# Patient Record
Sex: Male | Born: 2005
Health system: Southern US, Community
[De-identification: ages and names within clinical notes are randomized; demographics above are authoritative.]

## PROBLEM LIST (undated history)

## (undated) DIAGNOSIS — J121 Respiratory syncytial virus pneumonia: Secondary | ICD-10-CM

## (undated) HISTORY — DX: Respiratory syncytial virus pneumonia: J12.1

## (undated) HISTORY — PX: LIVER BIOPSY: SHX301

## (undated) HISTORY — PX: LEG SURGERY: SHX1003

## (undated) HISTORY — PX: CIRCUMCISION: SHX1350

---
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
## 2024-01-05 ENCOUNTER — Emergency Department (HOSPITAL_COMMUNITY)

## 2024-01-05 ENCOUNTER — Other Ambulatory Visit: Payer: Self-pay

## 2024-01-05 ENCOUNTER — Emergency Department (HOSPITAL_COMMUNITY): Admission: EM | Admit: 2024-01-05 | Discharge: 2024-01-05 | Disposition: A | Discharge status: Home / Self Care

## 2024-01-05 ENCOUNTER — Encounter (HOSPITAL_COMMUNITY): Payer: Self-pay

## 2024-01-05 DIAGNOSIS — R0789 Other chest pain: Secondary | ICD-10-CM

## 2024-01-05 DIAGNOSIS — R079 Chest pain, unspecified: Secondary | ICD-10-CM | POA: Insufficient documentation

## 2024-01-05 LAB — CBC WITH DIFFERENTIAL/PLATELET
Abs Immature Granulocytes: 0.04 K/uL (ref 0.00–0.07)
Basophils Absolute: 0.1 K/uL (ref 0.0–0.1)
Basophils Relative: 1 %
Eosinophils Absolute: 0.2 K/uL (ref 0.0–0.5)
Eosinophils Relative: 2 %
HCT: 47.9 % (ref 39.0–52.0)
Hemoglobin: 16.4 g/dL (ref 13.0–17.0)
Immature Granulocytes: 0 %
Lymphocytes Relative: 20 %
Lymphs Abs: 2 K/uL (ref 0.7–4.0)
MCH: 29.9 pg (ref 26.0–34.0)
MCHC: 34.2 g/dL (ref 30.0–36.0)
MCV: 87.2 fL (ref 80.0–100.0)
Monocytes Absolute: 0.7 K/uL (ref 0.1–1.0)
Monocytes Relative: 7 %
Neutro Abs: 7.3 K/uL (ref 1.7–7.7)
Neutrophils Relative %: 70 %
Platelets: 299 K/uL (ref 150–400)
RBC: 5.49 MIL/uL (ref 4.22–5.81)
RDW: 12.9 % (ref 11.5–15.5)
WBC: 10.3 K/uL (ref 4.0–10.5)
nRBC: 0 % (ref 0.0–0.2)

## 2024-01-05 LAB — BASIC METABOLIC PANEL WITH GFR
Anion gap: 10 (ref 5–15)
BUN: 8 mg/dL (ref 6–20)
CO2: 24 mmol/L (ref 22–32)
Calcium: 9.6 mg/dL (ref 8.9–10.3)
Chloride: 106 mmol/L (ref 98–111)
Creatinine, Ser: 0.71 mg/dL (ref 0.61–1.24)
GFR, Estimated: >60 mL/min (ref >60)
Glucose, Bld: 103 mg/dL — ABNORMAL HIGH (ref 70–99)
Potassium: 4.1 mmol/L (ref 3.5–5.1)
Sodium: 140 mmol/L (ref 135–145)

## 2024-01-05 LAB — TROPONIN I (HIGH SENSITIVITY): Troponin I (High Sensitivity): <2 ng/L (ref <18)

## 2024-01-05 MED ORDER — KETOROLAC TROMETHAMINE 15 MG/ML IJ SOLN
15.0000 mg | Freq: Once | INTRAMUSCULAR | Status: AC
  Administered 2024-01-05: 15 mg via INTRAMUSCULAR
  Filled 2024-01-05: qty 1

## 2024-01-05 NOTE — ED Provider Notes (Signed)
 [place] EMERGENCY DEPARTMENT AT [facility] Provider Note   CSN: [id number] Arrival date & time: 01/05/24  1742     Patient presents with: Chest Pain   Marvin Lopez is a 18 y.o. male.   18 year old male presents for evaluation of chest pain.  States that started yesterday.  States has been off-and-on and sharp in nature located in the center of his chest.  He has not been short of breath or had any nausea or vomiting or lightheadedness.  He denies any other symptoms or concerns.   Chest Pain Associated symptoms: no abdominal pain, no back pain, no cough, no fever, no palpitations, no shortness of breath and no vomiting        Prior to Admission medications   Medication Sig Start Date End Date Taking? Authorizing Provider  albuterol  (VENTOLIN  HFA) 108 (90 Base) MCG/ACT inhaler Inhale 2 puffs into the lungs every 6 (six) hours as needed for wheezing or shortness of breath.    [provider]  Albuterol -Budesonide (AIRSUPRA ) 90-80 MCG/ACT AERO Inhale 2 Inhalations into the lungs as needed (for wheezing or shortness of breath). Up to 12 inhalations a day 04/07/23   Joesph Annabella HERO, FNP    Allergies: Banana, Cashew nut (anacardium occidentale) skin test, Cashew nut oil, and Amoxicillin    Review of Systems  Constitutional:  Negative for chills and fever.  HENT:  Negative for ear pain and sore throat.   Eyes:  Negative for pain and visual disturbance.  Respiratory:  Negative for cough and shortness of breath.   Cardiovascular:  Positive for chest pain. Negative for palpitations.  Gastrointestinal:  Negative for abdominal pain and vomiting.  Genitourinary:  Negative for dysuria and hematuria.  Musculoskeletal:  Negative for arthralgias and back pain.  Skin:  Negative for color change and rash.  Neurological:  Negative for seizures and syncope.  All other systems reviewed and are negative.   Updated Vital Signs BP 128/71 (BP Location: Right Arm)    Pulse 72   Temp 98 F (36.7 C) (Oral)   Resp 18   Ht 5' 9 (1.753 m)   Wt 117.9 kg   SpO2 100%   BMI 38.40 kg/m   Physical Exam Vitals and nursing note reviewed.  Constitutional:      General: He is not in acute distress.    Appearance: He is well-developed. He is not ill-appearing, toxic-appearing or diaphoretic.  HENT:     Head: Normocephalic and atraumatic.  Eyes:     Conjunctiva/sclera: Conjunctivae normal.  Cardiovascular:     Rate and Rhythm: Normal rate and regular rhythm.     Heart sounds: No murmur heard. Pulmonary:     Effort: Pulmonary effort is normal. No respiratory distress.     Breath sounds: Normal breath sounds.  Abdominal:     Palpations: Abdomen is soft.     Tenderness: There is no abdominal tenderness.  Musculoskeletal:        General: No swelling.     Cervical back: Neck supple.  Skin:    General: Skin is warm and dry.     Capillary Refill: Capillary refill takes less than 2 seconds.  Neurological:     Mental Status: He is alert.  Psychiatric:        Mood and Affect: Mood normal.     (all labs ordered are listed, but only abnormal results are displayed) Labs Reviewed  BASIC METABOLIC PANEL WITH GFR - Abnormal; Notable for the following components:  Result Value   Glucose, Bld 103 (*)    All other components within normal limits  CBC WITH DIFFERENTIAL/PLATELET  TROPONIN I (HIGH SENSITIVITY)  TROPONIN I (HIGH SENSITIVITY)    EKG: EKG Interpretation Date/Time:  Wednesday January 05 2024 17:58:11 EDT Ventricular Rate:  93 PR Interval:  146 QRS Duration:  96 QT Interval:  364 QTC Calculation: 452 R Axis:   28  Text Interpretation: Sinus rhythm with marked sinus arrhythmia Nonspecific ST changes in the inferior leads Confirmed by Gennaro Bouchard ([id number]) on 01/05/2024 6:04:04 PM  Radiology: [facility] Chest 2 View Result Date: 01/05/2024 CLINICAL DATA:  Chest pain. EXAM: CHEST - 2 VIEW COMPARISON:  04/07/2023 FINDINGS: The  cardiomediastinal contours are normal. The lungs are clear. Pulmonary vasculature is normal. No consolidation, pleural effusion, or pneumothorax. No acute osseous abnormalities are seen. IMPRESSION: No active cardiopulmonary disease. Electronically Signed   By: Andrea Gasman M.D.   On: 01/05/2024 18:43     Procedures   Medications Ordered in the ED  ketorolac  (TORADOL ) 15 MG/ML injection 15 mg (15 mg Intramuscular Given 01/05/24 2050)                                    Medical Decision Making Cardiac monitor interpretation: Sinus rhythm, no ectopy  Patient here for what sounds like musculoskeletal chest pain.  He is chest pain-free at this time.  Lab workup unremarkable, troponin negative chest x-ray and EKG within normal limits.  Patient be given Toradol  here.  Advised Tylenol Motrin as needed for pain.  Advise follow-up with primary care doctor as needed otherwise return to the ER for any new or worsening symptoms.  Patient feels comfortable to plan be discharged home.  Problems Addressed: Musculoskeletal chest pain: acute illness or injury  Amount and/or Complexity of Data Reviewed External Data Reviewed: notes.    Details: Prior ED records reviewed and patient was seen last year for cough Labs: ordered. Decision-making details documented in ED Course.    Details: Ordered and reviewed by me and unremarkable Radiology: ordered and independent interpretation performed. Decision-making details documented in ED Course.    Details: Ordered and interpreted by me independently radiology Chest x-ray: Shows no acute abnormality in the chest ECG/medicine tests: ordered and independent interpretation performed. Decision-making details documented in ED Course.    Details: Ordered and interpreted by me in the absence of cardiology and shows sinus rhythm, no STEMI or acute change when compared to prior EKG  Risk OTC drugs. Prescription drug management.    Final diagnoses:   Musculoskeletal chest pain    ED Discharge Orders     None          Gennaro Bouchard CROME, DO 01/05/24 2059

## 2024-01-05 NOTE — ED Triage Notes (Signed)
 Reports started having central chest pain yesterday b ut today has not gone away.  Reports worse in certain positions.  Reports unsure if its from smoking or muscular.

## 2024-01-05 NOTE — Discharge Instructions (Addendum)
 You can alternate Tylenol and Motrin as needed for pain.  Follow-up with your primary care doctor as needed.

## 2024-02-17 ENCOUNTER — Ambulatory Visit (INDEPENDENT_AMBULATORY_CARE_PROVIDER_SITE_OTHER): Admitting: Family Medicine

## 2024-02-17 ENCOUNTER — Encounter: Payer: Self-pay | Admitting: Family Medicine

## 2024-02-17 VITALS — BP 132/70 | HR 86 | Temp 98.4°F | Ht 69.0 in | Wt 309.2 lb

## 2024-02-17 DIAGNOSIS — Z23 Encounter for immunization: Secondary | ICD-10-CM | POA: Diagnosis not present

## 2024-02-17 DIAGNOSIS — Z0001 Encounter for general adult medical examination with abnormal findings: Secondary | ICD-10-CM | POA: Diagnosis not present

## 2024-02-17 DIAGNOSIS — J4521 Mild intermittent asthma with (acute) exacerbation: Secondary | ICD-10-CM | POA: Diagnosis not present

## 2024-02-17 DIAGNOSIS — Z0000 Encounter for general adult medical examination without abnormal findings: Secondary | ICD-10-CM

## 2024-02-17 DIAGNOSIS — K760 Fatty (change of) liver, not elsewhere classified: Secondary | ICD-10-CM

## 2024-02-17 NOTE — Patient Instructions (Signed)
 Health Maintenance, Male  Adopting a healthy lifestyle and getting preventive care are important in promoting health and wellness. Ask your health care provider about:  The right schedule for you to have regular tests and exams.  Things you can do on your own to prevent diseases and keep yourself healthy.  What should I know about diet, weight, and exercise?  Eat a healthy diet    Eat a diet that includes plenty of vegetables, fruits, low-fat dairy products, and lean protein.  Do not eat a lot of foods that are high in solid fats, added sugars, or sodium.  Maintain a healthy weight  Body mass index (BMI) is a measurement that can be used to identify possible weight problems. It estimates body fat based on height and weight. Your health care provider can help determine your BMI and help you achieve or maintain a healthy weight.  Get regular exercise  Get regular exercise. This is one of the most important things you can do for your health. Most adults should:  Exercise for at least 150 minutes each week. The exercise should increase your heart rate and make you sweat (moderate-intensity exercise).  Do strengthening exercises at least twice a week. This is in addition to the moderate-intensity exercise.  Spend less time sitting. Even light physical activity can be beneficial.  Watch cholesterol and blood lipids  Have your blood tested for lipids and cholesterol at 19 years of age, then have this test every 5 years.  You may need to have your cholesterol levels checked more often if:  Your lipid or cholesterol levels are high.  You are older than 19 years of age.  You are at high risk for heart disease.  What should I know about cancer screening?  Many types of cancers can be detected early and may often be prevented. Depending on your health history and family history, you may need to have cancer screening at various ages. This may include screening for:  Colorectal cancer.  Prostate cancer.  Skin cancer.  Lung  cancer.  What should I know about heart disease, diabetes, and high blood pressure?  Blood pressure and heart disease  High blood pressure causes heart disease and increases the risk of stroke. This is more likely to develop in people who have high blood pressure readings or are overweight.  Talk with your health care provider about your target blood pressure readings.  Have your blood pressure checked:  Every 3-5 years if you are 19-52 years of age.  Every year if you are 3 years old or older.  If you are between the ages of 60 and 72 and are a current or former smoker, ask your health care provider if you should have a one-time screening for abdominal aortic aneurysm (AAA).  Diabetes  Have regular diabetes screenings. This checks your fasting blood sugar level. Have the screening done:  Once every three years after age 66 if you are at a normal weight and have a low risk for diabetes.  More often and at a younger age if you are overweight or have a high risk for diabetes.  What should I know about preventing infection?  Hepatitis B  If you have a higher risk for hepatitis B, you should be screened for this virus. Talk with your health care provider to find out if you are at risk for hepatitis B infection.  Hepatitis C  Blood testing is recommended for:  Everyone born from [birth year] through 1966.  Anyone  with known risk factors for hepatitis C.  Sexually transmitted infections (STIs)  You should be screened each year for STIs, including gonorrhea and chlamydia, if:  You are sexually active and are younger than 19 years of age.  You are older than 19 years of age and your health care provider tells you that you are at risk for this type of infection.  Your sexual activity has changed since you were last screened, and you are at increased risk for chlamydia or gonorrhea. Ask your health care provider if you are at risk.  Ask your health care provider about whether you are at high risk for HIV. Your health care provider  may recommend a prescription medicine to help prevent HIV infection. If you choose to take medicine to prevent HIV, you should first get tested for HIV. You should then be tested every 3 months for as long as you are taking the medicine.  Follow these instructions at home:  Alcohol use  Do not drink alcohol if your health care provider tells you not to drink.  If you drink alcohol:  Limit how much you have to 0-2 drinks a day.  Know how much alcohol is in your drink. In the [place] one drink equals one 12 oz bottle of beer (355 mL), one 5 oz glass of wine (148 mL), or one 1 oz glass of hard liquor (44 mL).  Lifestyle  Do not use any products that contain nicotine or tobacco. These products include cigarettes, chewing tobacco, and vaping devices, such as e-cigarettes. If you need help quitting, ask your health care provider.  Do not use street drugs.  Do not share needles.  Ask your health care provider for help if you need support or information about quitting drugs.  General instructions  Schedule regular health, dental, and eye exams.  Stay current with your vaccines.  Tell your health care provider if:  You often feel depressed.  You have ever been abused or do not feel safe at home.  Summary  Adopting a healthy lifestyle and getting preventive care are important in promoting health and wellness.  Follow your health care provider's instructions about healthy diet, exercising, and getting tested or screened for diseases.  Follow your health care provider's instructions on monitoring your cholesterol and blood pressure.  This information is not intended to replace advice given to you by your health care provider. Make sure you discuss any questions you have with your health care provider.  Document Revised: 08/26/2020 Document Reviewed: 08/26/2020  Elsevier Patient Education  2024 [employer].

## 2024-02-17 NOTE — Progress Notes (Signed)
 Complete physical exam  Patient: Marvin Lopez   DOB: Mar 27, 2006   18 y.o. Male  MRN: [id number]  Subjective:    Chief Complaint  Patient presents with   Annual Exam    Marvin Lopez is a 18 y.o. male who presents today for a complete physical exam. He reports consuming a general diet. He just bought a optometrist. He has been walking some. He generally feels well. He reports sleeping well. He does not have additional problems to discuss today.   Weight fluctuations and nutritional status - Significant weight loss from 400 pounds to 230 pounds over the past year, followed by weight gain to 309 pounds - Diet described as 'not the greatest'  Chest discomfort and recent hospital evaluation - Recent episode of chest inflammation approximately 1-2 months ago, evaluated at a hospital in [place] - Chest x-ray, basic laboratory work, and EKG performed at that time were all normal - Continues to experience mild chest discomfort with stretching, gradually improving - No current chest pain, shortness of breath, peripheral edema, nausea, vomiting, or abdominal pain  Asthma symptoms - Asthma symptoms are well controlled with no current issues  Physical activity and sleep - Engages in physical activity including use of a [employer] and walking with his father when possible - Sleeping well      Most recent fall risk assessment:    11/17/2022    2:28 PM  Fall Risk   Falls in the past year? 0     Most recent depression screenings:    11/17/2022    2:28 PM 01/19/2022   10:52 AM  PHQ 2/9 Scores  PHQ - 2 Score 0 0  PHQ- 9 Score  0    Vision:Not within last year  and Dental: Receives regular dental care    Patient Care Team: Joesph Annabella HERO, FNP as PCP - General (Family Medicine)   Outpatient Medications Prior to Visit  Medication Sig   albuterol  (VENTOLIN  HFA) 108 (90 Base) MCG/ACT inhaler Inhale 2 puffs into the lungs every 6 (six) hours as needed for wheezing or shortness  of breath.   Albuterol -Budesonide (AIRSUPRA ) 90-80 MCG/ACT AERO Inhale 2 Inhalations into the lungs as needed (for wheezing or shortness of breath). Up to 12 inhalations a day   No facility-administered medications prior to visit.    ROS Negative unless specially indicated above in HPI.        Objective:     BP 132/70   Pulse 86   Temp 98.4 F (36.9 C) (Temporal)   Ht 5' 9 (1.753 m)   Wt (!) 309 lb 3.2 oz (140.3 kg)   SpO2 99%   BMI 45.66 kg/m  Wt Readings from Last 3 Encounters:  02/17/24 (!) 309 lb 3.2 oz (140.3 kg) (>99%, Z= 3.15)*  01/05/24 260 lb (117.9 kg) (>99%, Z= 2.57)*  04/07/23 260 lb 9.6 oz (118.2 kg) (>99%, Z= 2.60)*   * Growth percentiles are based on CDC (Boys, 2-20 Years) data.      Physical Exam Vitals and nursing note reviewed.  Constitutional:      General: He is not in acute distress.    Appearance: He is obese. He is not ill-appearing, toxic-appearing or diaphoretic.  HENT:     Head: Normocephalic.     Right Ear: Tympanic membrane, ear canal and external ear normal.     Left Ear: Tympanic membrane, ear canal and external ear normal.     Nose: Nose normal.  Mouth/Throat:     Mouth: Mucous membranes are moist.     Pharynx: Oropharynx is clear.  Eyes:     Extraocular Movements: Extraocular movements intact.     Conjunctiva/sclera: Conjunctivae normal.     Pupils: Pupils are equal, round, and reactive to light.  Cardiovascular:     Rate and Rhythm: Normal rate and regular rhythm.     Pulses: Normal pulses.     Heart sounds: Normal heart sounds. No murmur heard.    No friction rub. No gallop.  Pulmonary:     Effort: Pulmonary effort is normal.     Breath sounds: Normal breath sounds.  Abdominal:     General: Bowel sounds are normal. There is no distension.     Palpations: Abdomen is soft. There is no mass.     Tenderness: There is no abdominal tenderness. There is no guarding.  Musculoskeletal:        General: No swelling. Normal  range of motion.     Cervical back: Normal range of motion and neck supple. No tenderness.  Skin:    General: Skin is warm and dry.     Capillary Refill: Capillary refill takes less than 2 seconds.     Findings: No lesion or rash.  Neurological:     General: No focal deficit present.     Mental Status: He is alert and oriented to person, place, and time.     Cranial Nerves: No cranial nerve deficit.     Motor: No weakness.     Coordination: Coordination normal.     Gait: Gait normal.  Psychiatric:        Mood and Affect: Mood normal.        Behavior: Behavior normal.        Thought Content: Thought content normal.        Judgment: Judgment normal.      No results found for any visits on 02/17/24.     Assessment & Plan:    Routine Health Maintenance and Physical Exam  [place] was seen today for annual exam.  Diagnoses and all orders for this visit:  Routine general medical examination at a health care facility  Fatty liver disease, nonalcoholic -     CBC with Differential/Platelet; Future -     CMP14+EGFR; Future  Mild intermittent asthma with acute exacerbation  Morbid obesity (HCC) -     Lipid panel; Future -     TSH; Future -     VITAMIN D 25 Hydroxy (Vit-D Deficiency, Fractures); Future -     Bayer DCA Hb A1c Waived; Future  Assessment and Plan    Morbid obesity due to excess calories Morbid obesity with significant weight fluctuations. Current weight 309 pounds, down from 400 pounds last year per patient. Engaging in physical activity. - Encouraged a well-balanced diet. - Recommended physical activity for at least 30 minutes daily. - He will return for fasting labs  NAFLD Will check liver enzymes.   Asthma Asthma symptoms well-controlled. Recent chest inflammation treated with normal chest x-ray, lab work, and EKG. Mild residual chest discomfort improving.  Adult Wellness Visit Routine visit with no acute concerns. Discussed importance of diet and  physical activity for health maintenance. - Ordered fasting lab work: A1c, liver enzymes, kidney function, cholesterol, thyroid  function, vitamin D levels. - Planned annual physical in one year.        Immunization History  Administered Date(s) Administered   DTaP 02/27/2005, 05/01/2005, 07/15/2005, 05/13/2006, 05/23/2009   HIB (  PRP-OMP) 02/27/2005, 05/01/2005   HPV 9-valent 11/23/2016, 10/08/2020   Hepatitis A 04/05/2006, 02/28/2007   Hepatitis A, Adult 04/05/2006, 02/28/2007   Hepatitis A, Ped/Adol-2 Dose 04/05/2006, 02/28/2007   Hepatitis B 04/25/04, 02/27/2005, 07/15/2005   Hepatitis B, PED/ADOLESCENT 07/15/2005   IPV 02/27/2005, 05/01/2005, 07/15/2005, 05/23/2009   Influenza Split 05/13/2006, 02/28/2007   Influenza, Seasonal, Injecte, Preservative Fre 04/05/2006, 02/11/2023   Influenza,inj,Quad PF,6+ Mos 04/19/2013, 04/19/2014, 02/17/2016   Influenza-Unspecified 02/17/2016   MMR 04/05/2006, 05/23/2009   Meningococcal Conjugate 11/23/2016   Pneumococcal Conjugate-13 02/27/2005, 05/01/2005, 07/15/2005, 05/13/2006   Pneumococcal-Unspecified 02/27/2005, 05/01/2005, 07/15/2005, 05/13/2006   Tdap 11/23/2016   Varicella 04/05/2006, 05/23/2009    Health Maintenance  Topic Date Due   Influenza Vaccine  11/19/2023   Pneumococcal Vaccine (1 of 1 - PPSV23, PCV20, or PCV21) 02/16/2025 (Originally 12/17/2010)   Hepatitis C Screening  02/16/2025 (Originally 12/17/2022)   HIV Screening  02/16/2025 (Originally 12/17/2019)   Meningococcal B Vaccine (1 of 2 - Standard) 02/16/2025 (Originally 12/16/2020)   COVID-19 Vaccine (1 - 2025-26 season) 03/04/2025 (Originally 12/20/2023)   DTaP/Tdap/Td (7 - Td or Tdap) 11/24/2026   Hepatitis B Vaccines 19-59 Average Risk  Completed   HPV VACCINES  Completed    Discussed health benefits of physical activity, and encouraged him to engage in regular exercise appropriate for his age and condition.  Problem List Items Addressed This Visit        Respiratory   Asthma   Other Visit Diagnoses       Routine general medical examination at a health care facility    -  Primary     Fatty liver disease, nonalcoholic       Relevant Orders   CBC with Differential/Platelet   CMP14+EGFR     Morbid obesity (HCC)       Relevant Orders   Lipid panel   TSH   VITAMIN D 25 Hydroxy (Vit-D Deficiency, Fractures)   Bayer DCA Hb A1c Waived      Return in 1 year (on 02/16/2025).   The patient indicates understanding of these issues and agrees with the plan.  Annabella CHRISTELLA Search, FNP

## 2024-02-17 NOTE — Addendum Note (Signed)
 Addended by: JOESPH ANNABELLA HERO on: 02/17/2024 02:22 PM   Modules accepted: Level of Service

## 2024-02-17 NOTE — Addendum Note (Signed)
 Addended by: Tacha Manni G on: 02/17/2024 01:34 PM   Modules accepted: Orders

## 2024-02-21 ENCOUNTER — Other Ambulatory Visit

## 2024-02-21 DIAGNOSIS — K760 Fatty (change of) liver, not elsewhere classified: Secondary | ICD-10-CM

## 2024-02-21 LAB — BAYER DCA HB A1C WAIVED: HB A1C (BAYER DCA - WAIVED): 4.9 % (ref 4.8–5.6)

## 2024-02-21 LAB — CBC WITH DIFFERENTIAL/PLATELET

## 2024-02-22 LAB — LIPID PANEL
Chol/HDL Ratio: 3 ratio (ref 0.0–5.0)
Cholesterol, Total: 149 mg/dL (ref 100–169)
HDL: 50 mg/dL (ref >39)
LDL Chol Calc (NIH): 86 mg/dL (ref 0–109)
Triglycerides: 63 mg/dL (ref 0–89)
VLDL Cholesterol Cal: 13 mg/dL (ref 5–40)

## 2024-02-22 LAB — CBC WITH DIFFERENTIAL/PLATELET
Basos: 1 %
EOS (ABSOLUTE): 0.1 x10E3/uL (ref 0.0–0.2)
Eos: 6 %
Hematocrit: 48.7 % (ref 37.5–51.0)
Hemoglobin: 16.2 g/dL (ref 13.0–17.7)
Immature Granulocytes: 0 %
Immature Granulocytes: 0 x10E3/uL (ref 0.0–0.1)
Lymphs: 34 %
MCH: 29.8 pg (ref 26.6–33.0)
MCHC: 33.3 g/dL (ref 31.5–35.7)
MCV: 90 fL (ref 79–97)
Monocytes Absolute: 0.5 x10E3/uL — AB (ref 0.0–0.4)
Monocytes Absolute: 0.7 x10E3/uL (ref 0.1–0.9)
Monocytes: 8 %
Neutrophils Absolute: 3.1 x10E3/uL (ref 0.7–3.1)
Neutrophils Absolute: 4.6 x10E3/uL (ref 1.4–7.0)
Neutrophils: 51 %
Platelets: 278 x10E3/uL (ref 150–450)
RBC: 5.43 x10E6/uL (ref 4.14–5.80)
RDW: 13.5 % (ref 11.6–15.4)
WBC: 9 x10E3/uL (ref 3.4–10.8)

## 2024-02-22 LAB — CMP14+EGFR
ALT: 65 IU/L — ABNORMAL HIGH (ref 0–44)
AST: 34 IU/L (ref 0–40)
Albumin: 4.6 g/dL (ref 4.3–5.2)
Alkaline Phosphatase: 97 IU/L (ref 51–125)
BUN/Creatinine Ratio: 11 (ref 9–20)
BUN: 8 mg/dL (ref 6–20)
Bilirubin Total: 0.6 mg/dL (ref 0.0–1.2)
CO2: 19 mmol/L — ABNORMAL LOW (ref 20–29)
Calcium: 9.4 mg/dL (ref 8.7–10.2)
Chloride: 104 mmol/L (ref 96–106)
Creatinine, Ser: 0.72 mg/dL — ABNORMAL LOW (ref 0.76–1.27)
Globulin, Total: 2.2 g/dL (ref 1.5–4.5)
Glucose: 90 mg/dL (ref 70–99)
Potassium: 4.4 mmol/L (ref 3.5–5.2)
Sodium: 140 mmol/L (ref 134–144)
Total Protein: 6.8 g/dL (ref 6.0–8.5)
eGFR: 135 mL/min/1.73 (ref >59)

## 2024-02-22 LAB — TSH: TSH: 2.08 u[IU]/mL (ref 0.450–4.500)

## 2024-02-22 LAB — VITAMIN D 25 HYDROXY (VIT D DEFICIENCY, FRACTURES): Vit D, 25-Hydroxy: 8 ng/mL — AB (ref 30.0–100.0)

## 2024-02-23 ENCOUNTER — Ambulatory Visit: Payer: Self-pay | Admitting: Family Medicine

## 2024-02-23 DIAGNOSIS — K760 Fatty (change of) liver, not elsewhere classified: Secondary | ICD-10-CM

## 2024-02-23 DIAGNOSIS — E559 Vitamin D deficiency, unspecified: Secondary | ICD-10-CM

## 2024-02-23 DIAGNOSIS — R7989 Other specified abnormal findings of blood chemistry: Secondary | ICD-10-CM

## 2024-02-23 MED ORDER — VITAMIN D (ERGOCALCIFEROL) 1.25 MG (50000 UNIT) PO CAPS: 50000.0000 [IU] | ORAL_CAPSULE | ORAL | 0 refills | Status: AC

## 2024-04-04 ENCOUNTER — Encounter (INDEPENDENT_AMBULATORY_CARE_PROVIDER_SITE_OTHER): Payer: Self-pay | Admitting: *Deleted

## 2024-04-27 ENCOUNTER — Ambulatory Visit: Admitting: Family

## 2024-04-27 ENCOUNTER — Encounter: Payer: Self-pay | Admitting: Family

## 2024-04-27 VITALS — BP 135/64 | HR 93 | Temp 97.8°F | Ht 69.0 in | Wt 309.0 lb

## 2024-04-27 DIAGNOSIS — J4521 Mild intermittent asthma with (acute) exacerbation: Secondary | ICD-10-CM

## 2024-04-27 DIAGNOSIS — J209 Acute bronchitis, unspecified: Secondary | ICD-10-CM | POA: Diagnosis not present

## 2024-04-27 MED ORDER — PREDNISONE 20 MG PO TABS: 40.0000 mg | ORAL_TABLET | Freq: Every day | ORAL | 0 refills | Status: AC

## 2024-04-27 NOTE — Progress Notes (Signed)
 "  Subjective:    Patient ID: Marvin Lopez, male    DOB: 08/09/2005, 19 y.o.   MRN: [id number]  Chief Complaint  Patient presents with   Cough   Nasal Congestion    All this has been going on for 6 days. Patient denies fever. Patient did not do any at home covid of flu test.   Pt presents to the office today with cough that started 6 days ago and feeling slightly better.  Cough This is a new problem. The current episode started in the past 7 days. The problem has been gradually improving. The problem occurs every few minutes. The cough is Productive of sputum and productive of purulent sputum. Associated symptoms include headaches, nasal congestion, postnasal drip and wheezing. Pertinent negatives include no chills, ear congestion, ear pain, fever, myalgias or shortness of breath. He has tried rest and OTC cough suppressant for the symptoms. His past medical history is significant for asthma.      Review of Systems  Constitutional:  Negative for chills and fever.  HENT:  Positive for postnasal drip. Negative for ear pain.   Respiratory:  Positive for cough and wheezing. Negative for shortness of breath.   Musculoskeletal:  Negative for myalgias.  Neurological:  Positive for headaches.  All other systems reviewed and are negative.   Social History   Socioeconomic History   Marital status: Single    Spouse name: Not on file   Number of children: Not on file   Years of education: Not on file   Highest education level: Not on file  Occupational History   Not on file  Tobacco Use   Smoking status: Never    Passive exposure: Yes   Smokeless tobacco: Never   Tobacco comments:    Mom smokes  Vaping Use   Vaping status: Former  Substance and Sexual Activity   Alcohol use: Yes    Alcohol/week: 1.0 standard drink of alcohol    Types: 1 Glasses of wine per week   Drug use: Yes    Frequency: 3.0 times per week    Types: Marijuana   Sexual activity: Yes  Other Topics Concern    Not on file  Social History Narrative   Lives with mom, cousin, and step dad primarily spends time at dads house  Where it is dad and grandma.    He will start 7th grade in the fall, he will attend [employer].    Social Drivers of Health   Tobacco Use: Medium Risk (04/27/2024)   Patient History    Smoking Tobacco Use: Never    Smokeless Tobacco Use: Never    Passive Exposure: Yes  Financial Resource Strain: Not on file  Food Insecurity: Not on file  Transportation Needs: Not on file  Physical Activity: Not on file  Stress: Not on file  Social Connections: Not on file  Depression (PHQ2-9): Low Risk (02/17/2024)   Depression (PHQ2-9)    PHQ-2 Score: 0  Alcohol Screen: Not on file  Housing: Not on file  Utilities: Not on file  Health Literacy: Not on file   Family History  Problem Relation Age of Onset   Hypothyroidism Mother    Asthma Mother    Diabetes Father    Diverticulitis Father    Diabetes Paternal Uncle         Objective:   Physical Exam Vitals reviewed.  Constitutional:      General: He is not in acute distress.  Appearance: He is well-developed.  HENT:     Head: Normocephalic.     Right Ear: Tympanic membrane normal.     Left Ear: Tympanic membrane normal.  Eyes:     General:        Right eye: No discharge.        Left eye: No discharge.     Pupils: Pupils are equal, round, and reactive to light.  Neck:     Thyroid : No thyromegaly.  Cardiovascular:     Rate and Rhythm: Normal rate and regular rhythm.     Heart sounds: Normal heart sounds. No murmur heard. Pulmonary:     Effort: Pulmonary effort is normal. No respiratory distress.     Breath sounds: Normal breath sounds. No wheezing.     Comments: Coarse nonproductive Abdominal:     General: Bowel sounds are normal. There is no distension.     Palpations: Abdomen is soft.     Tenderness: There is no abdominal tenderness.  Musculoskeletal:        General: No tenderness. Normal  range of motion.     Cervical back: Normal range of motion and neck supple.  Skin:    General: Skin is warm and dry.     Findings: No erythema or rash.  Neurological:     Mental Status: He is alert and oriented to person, place, and time.     Cranial Nerves: No cranial nerve deficit.     Deep Tendon Reflexes: Reflexes are normal and symmetric.  Psychiatric:        Behavior: Behavior normal.        Thought Content: Thought content normal.        Judgment: Judgment normal.       BP 135/64   Pulse 93   Temp 97.8 F (36.6 C) (Temporal)   Ht 5' 9 (1.753 m)   Wt (!) 309 lb (140.2 kg)   SpO2 96%   BMI 45.63 kg/m      Assessment & Plan:  Marvin Lopez comes in today with chief complaint of Cough and Nasal Congestion (All this has been going on for 6 days. Patient denies fever. Patient did not do any at home covid of flu test.)   Diagnosis and orders addressed:  1. Acute bronchitis, unspecified organism (Primary) - Start prednisone   - Use a cool mist humidifier  -Use saline nose sprays frequently -Force fluids -For any cough or congestion  Use plain Mucinex- regular strength or max strength is fine -For fever or aces or pains- take tylenol or ibuprofen. -Throat lozenges if help -Follow up if symptoms worsen or do not improve  - predniSONE  (DELTASONE ) 20 MG tablet; Take 2 tablets (40 mg total) by mouth daily with breakfast for 5 days. 2 po daily for 5 days  Dispense: 10 tablet; Refill: 0  2. Mild intermittent asthma with acute exacerbation - predniSONE  (DELTASONE ) 20 MG tablet; Take 2 tablets (40 mg total) by mouth daily with breakfast for 5 days. 2 po daily for 5 days  Dispense: 10 tablet; Refill: 0     Bari Learn, FNP   "

## 2024-04-27 NOTE — Patient Instructions (Signed)

## 2024-05-08 ENCOUNTER — Encounter (INDEPENDENT_AMBULATORY_CARE_PROVIDER_SITE_OTHER): Payer: Self-pay | Admitting: Gastroenterology

## 2024-05-08 ENCOUNTER — Ambulatory Visit (INDEPENDENT_AMBULATORY_CARE_PROVIDER_SITE_OTHER): Admitting: Gastroenterology

## 2024-05-08 VITALS — BP 131/75 | HR 99 | Temp 97.5°F | Ht 68.0 in | Wt 324.0 lb

## 2024-05-08 DIAGNOSIS — R748 Abnormal levels of other serum enzymes: Secondary | ICD-10-CM | POA: Insufficient documentation

## 2024-05-08 DIAGNOSIS — R7401 Elevation of levels of liver transaminase levels: Secondary | ICD-10-CM | POA: Diagnosis not present

## 2024-05-08 DIAGNOSIS — Z6841 Body Mass Index (BMI) 40.0 and over, adult: Secondary | ICD-10-CM

## 2024-05-08 DIAGNOSIS — K760 Fatty (change of) liver, not elsewhere classified: Secondary | ICD-10-CM | POA: Diagnosis not present

## 2024-05-08 NOTE — Patient Instructions (Signed)
 It was very nice to meet you today, as dicussed with will plan for the following :  1)        - walking at a brisk pace/biking at moderate intesity 2.5-5 hours per week     - use pedometer/step counter to track activity     - goal to lose 5-10% of initial body weight     - avoid suagry drinks and juices, use zero calorie beverages     - increase water intake     - eat a low carb diet with plenty of veggies and fruit     - Get sufficient sleep 7-8 hrs nightly     - maitain active lifestyle     - avoid alcohol     - recommend 2-3 cups Coffee daily     - Counsel on lowering cholesterol by having a diet rich in vegetables,          protein (avoid red meats) and good fats(fish, salmon). Talk to your PCP about  GLP-1 such as semaglutide. This will also help promote weight loss

## 2024-05-08 NOTE — Progress Notes (Signed)
 Kenecia Barren Faizan Jasleen Riepe , M.D. Gastroenterology & Hepatology [facility] [address] [place], [place] [address] Primary Care Physician: Joesph Annabella HERO, FNP [address] [place] [address]  Chief Complaint: Elevated liver enzymes/hepatic steatosis   History of Present Illness: Marvin Lopez is a 19 y.o. male who presents for evaluation of Elevated liver enzymes/hepatic steatosis  Patient denies any generalized itching or yellowing of the skin.  No family history of any liver disease.  Patient denies any alcohol use.  Diet is high in fast food and sugary beverages The patient denies having any nausea, vomiting, fever, chills, hematochezia, melena, hematemesis, abdominal distention, abdominal pain, diarrhea, jaundice, pruritus or weight loss.  Last ZHI:wnwz Last Colonoscopy:none  FHx: neg for any gastrointestinal/liver disease, no malignancies Social: neg smoking, alcohol or illicit drug use Surgical: no abdominal surgeries  Labs from 02/2024 ALT 65 AST 34 platelet 278  Past Medical History: Past Medical History:  Diagnosis Date   RSV (respiratory syncytial virus pneumonia)    Hospitilized at 4 months for 1 week.    Past Surgical History: Past Surgical History:  Procedure Laterality Date   CIRCUMCISION     LEG SURGERY     LIVER BIOPSY      Family History: Family History  Problem Relation Age of Onset   Hypothyroidism Mother    Asthma Mother    Diabetes Father    Diverticulitis Father    Diabetes Paternal Uncle     Social History:Tobacco Use History[1] Social History   Substance and Sexual Activity  Alcohol Use Not Currently   Alcohol/week: 1.0 standard drink of alcohol   Types: 1 Glasses of wine per week   Social History   Substance and Sexual Activity  Drug Use Yes   Frequency: 3.0 times per week   Types: Marijuana    Allergies: Allergies[2]  Medications: Current Outpatient Medications   Medication Sig Dispense Refill   albuterol  (VENTOLIN  HFA) 108 (90 Base) MCG/ACT inhaler Inhale 2 puffs into the lungs every 6 (six) hours as needed for wheezing or shortness of breath.     Vitamin D , Ergocalciferol , (DRISDOL ) 1.25 MG (50000 UNIT) CAPS capsule Take 1 capsule (50,000 Units total) by mouth every 7 (seven) days. 12 capsule 0   No current facility-administered medications for this visit.    Review of Systems: GENERAL: negative for malaise, night sweats HEENT: No changes in hearing or vision, no nose bleeds or other nasal problems. NECK: Negative for lumps, goiter, pain and significant neck swelling RESPIRATORY: Negative for cough, wheezing CARDIOVASCULAR: Negative for chest pain, leg swelling, palpitations, orthopnea GI: SEE HPI MUSCULOSKELETAL: Negative for joint pain or swelling, back pain, and muscle pain. SKIN: Negative for lesions, rash HEMATOLOGY Negative for prolonged bleeding, bruising easily, and swollen nodes. ENDOCRINE: Negative for cold or heat intolerance, polyuria, polydipsia and goiter. NEURO: negative for tremor, gait imbalance, syncope and seizures. The remainder of the review of systems is noncontributory.   Physical Exam: BP 131/75   Pulse 99   Temp (!) 97.5 F (36.4 C)   Ht 5' 8 (1.727 m)   Wt (!) 324 lb (147 kg)   BMI 49.26 kg/m  GENERAL: The patient is AO x3, in no acute distress. HEENT: Head is normocephalic and atraumatic. EOMI are intact. Mouth is well hydrated and without lesions. NECK: Supple. No masses LUNGS: Clear to auscultation. No presence of rhonchi/wheezing/rales. Adequate chest expansion HEART: RRR, normal s1 and s2. ABDOMEN: Soft, nontender, no guarding, no peritoneal  signs, and nondistended. BS +. No masses.   Imaging/Labs: as above     Latest Ref Rng & Units 02/21/2024    9:39 AM 01/05/2024    7:22 PM 02/09/2023    7:20 PM  CBC  WBC 3.4 - 10.8 x10E3/uL 9.0  10.3  9.0   Hemoglobin 13.0 - 17.7 g/dL 83.7  83.5  83.7    Hematocrit 37.5 - 51.0 % 48.7  47.9  46.6   Platelets 150 - 450 x10E3/uL 278  299  274    Lab Results  Component Value Date   FERRITIN 29 01/27/2017    I personally reviewed and interpreted the available labs, imaging and endoscopic files.  Ultrasound abdomen  IMPRESSION: 1. Mildly increased hepatic echogenicity can be seen in the setting of reported hepatic steatosis. 2. No cholelithiasis or sonographic evidence of acute cholecystitis.    Impression and Plan:  Marvin Lopez is a 19 y.o. male who presents for evaluation of Elevated liver enzymes/hepatic steatosis  # Elevated liver enzymes  #BMI 49   Fibrosis 4 Score = .29  Fib-4 interpretation is not validated for people under 35 or over 70 years of age. However, scores under 2.0 are generally considered low risk.  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  Mainstay of treatment remains weight loss  Recs:   Patient will be a candidate for Wegovy (semaglutide) which is approved for MASLD and patient will be an ideal candidate if he fails lifestyle modifications given young age and morbid obesity    Given elevation in ALT/AST will obtain baseline viral hepatitis profile, AMA, and autoimmune serologies and ELF testing to assess degree of fibrosis  Lifestyle modification      - walking at a brisk pace/biking at moderate intesity 2.5-5 hours per week     - use pedometer/step counter to track activity     - goal to lose 5-10% of initial body weight     - avoid suagry drinks and juices, use zero calorie beverages     - increase water intake     - eat a low carb diet with plenty of veggies and fruit      - Get sufficient sleep 7-8 hrs nightly     - maitain active lifestyle     - avoid alcohol     - recommend 2-3 cups Coffee daily     - Counsel on lowering cholesterol by having a diet rich in vegetables,          protein (avoid red meats) and good fats(fish, salmon).   All questions were answered.      Shae Augello Faizan Roberts Bon, MD Gastroenterology and Hepatology [facility]   This chart has been completed using [facility] Dictation software, and while attempts have been made to ensure accuracy , certain words and phrases may not be transcribed as intended      [1]  Social History Tobacco Use  Smoking Status Never   Passive exposure: Yes  Smokeless Tobacco Never  Tobacco Comments   Mom smokes  [2]  Allergies Allergen Reactions   Banana Rash and Shortness Of Breath  Anacardium Occidentale Rash   Cashew Nut Oil Rash   Amoxicillin Nausea And Vomiting and Other (See Comments)    Has patient had a PCN reaction causing immediate rash, facial/tongue/throat swelling, SOB or lightheadedness with hypotension: Yes Has patient had a PCN reaction causing severe rash involving mucus membranes or skin necrosis: No Has patient had a PCN reaction that required hospitalization: No Has patient had a PCN reaction occurring within the last 10 years: Yes If all of the above answers are NO, then may proceed with Cephalosporin use.

## 2024-05-22 ENCOUNTER — Other Ambulatory Visit: Payer: Self-pay | Admitting: Family Medicine

## 2024-05-22 DIAGNOSIS — E559 Vitamin D deficiency, unspecified: Secondary | ICD-10-CM

## 2025-02-19 ENCOUNTER — Encounter: Admitting: Family Medicine
